# Patient Record
Sex: Female | Born: 1985 | Race: Black or African American | Hispanic: No | Marital: Married
Health system: Southern US, Community
[De-identification: ages and names within clinical notes are randomized; demographics above are authoritative.]

## PROBLEM LIST (undated history)

## (undated) DIAGNOSIS — M329 Systemic lupus erythematosus, unspecified: Secondary | ICD-10-CM

## (undated) DIAGNOSIS — IMO0002 Reserved for concepts with insufficient information to code with codable children: Secondary | ICD-10-CM

---

## 2019-02-06 ENCOUNTER — Emergency Department (HOSPITAL_COMMUNITY): Payer: No Typology Code available for payment source

## 2019-02-06 ENCOUNTER — Encounter (HOSPITAL_COMMUNITY)
Admission: EM | Disposition: A | Discharge status: Home / Self Care | Payer: Self-pay | Source: Home / Self Care | Attending: Emergency Medicine

## 2019-02-06 ENCOUNTER — Ambulatory Visit (HOSPITAL_COMMUNITY)
Admission: EM | Admit: 2019-02-06 | Discharge: 2019-02-06 | Disposition: A | Discharge status: Home / Self Care | Payer: No Typology Code available for payment source | Attending: Emergency Medicine | Admitting: Emergency Medicine

## 2019-02-06 ENCOUNTER — Emergency Department (HOSPITAL_COMMUNITY): Payer: No Typology Code available for payment source | Admitting: Certified Registered Nurse Anesthetist

## 2019-02-06 ENCOUNTER — Other Ambulatory Visit: Payer: Self-pay

## 2019-02-06 ENCOUNTER — Encounter (HOSPITAL_COMMUNITY): Payer: Self-pay | Admitting: Emergency Medicine

## 2019-02-06 DIAGNOSIS — Z888 Allergy status to other drugs, medicaments and biological substances status: Secondary | ICD-10-CM | POA: Diagnosis not present

## 2019-02-06 DIAGNOSIS — J398 Other specified diseases of upper respiratory tract: Secondary | ICD-10-CM | POA: Insufficient documentation

## 2019-02-06 DIAGNOSIS — Z6833 Body mass index (BMI) 33.0-33.9, adult: Secondary | ICD-10-CM | POA: Diagnosis not present

## 2019-02-06 DIAGNOSIS — S161XXA Strain of muscle, fascia and tendon at neck level, initial encounter: Secondary | ICD-10-CM | POA: Insufficient documentation

## 2019-02-06 DIAGNOSIS — Z79899 Other long term (current) drug therapy: Secondary | ICD-10-CM | POA: Insufficient documentation

## 2019-02-06 DIAGNOSIS — S301XXA Contusion of abdominal wall, initial encounter: Secondary | ICD-10-CM | POA: Insufficient documentation

## 2019-02-06 DIAGNOSIS — Z23 Encounter for immunization: Secondary | ICD-10-CM | POA: Diagnosis not present

## 2019-02-06 DIAGNOSIS — T07XXXA Unspecified multiple injuries, initial encounter: Secondary | ICD-10-CM

## 2019-02-06 DIAGNOSIS — Z1159 Encounter for screening for other viral diseases: Secondary | ICD-10-CM | POA: Diagnosis not present

## 2019-02-06 DIAGNOSIS — E669 Obesity, unspecified: Secondary | ICD-10-CM | POA: Insufficient documentation

## 2019-02-06 DIAGNOSIS — S27321A Contusion of lung, unilateral, initial encounter: Secondary | ICD-10-CM | POA: Diagnosis not present

## 2019-02-06 DIAGNOSIS — S52501B Unspecified fracture of the lower end of right radius, initial encounter for open fracture type I or II: Secondary | ICD-10-CM | POA: Diagnosis present

## 2019-02-06 DIAGNOSIS — R221 Localized swelling, mass and lump, neck: Secondary | ICD-10-CM

## 2019-02-06 DIAGNOSIS — Y92410 Unspecified street and highway as the place of occurrence of the external cause: Secondary | ICD-10-CM | POA: Insufficient documentation

## 2019-02-06 DIAGNOSIS — M329 Systemic lupus erythematosus, unspecified: Secondary | ICD-10-CM | POA: Insufficient documentation

## 2019-02-06 DIAGNOSIS — S20219A Contusion of unspecified front wall of thorax, initial encounter: Secondary | ICD-10-CM | POA: Diagnosis not present

## 2019-02-06 HISTORY — PX: ORIF WRIST FRACTURE: SHX2133

## 2019-02-06 HISTORY — DX: Systemic lupus erythematosus, unspecified: M32.9

## 2019-02-06 HISTORY — DX: Reserved for concepts with insufficient information to code with codable children: IMO0002

## 2019-02-06 LAB — CBC
HCT: 39.5 % (ref 36.0–46.0)
Hemoglobin: 12.4 g/dL (ref 12.0–15.0)
MCH: 26.5 pg (ref 26.0–34.0)
MCHC: 31.4 g/dL (ref 30.0–36.0)
MCV: 84.4 fL (ref 80.0–100.0)
Platelets: 228 10*3/uL (ref 150–400)
RBC: 4.68 MIL/uL (ref 3.87–5.11)
RDW: 14.2 % (ref 11.5–15.5)
WBC: 10.5 10*3/uL (ref 4.0–10.5)
nRBC: 0 % (ref 0.0–0.2)

## 2019-02-06 LAB — SAMPLE TO BLOOD BANK

## 2019-02-06 LAB — COMPREHENSIVE METABOLIC PANEL
ALT: 29 U/L (ref 0–44)
AST: 28 U/L (ref 15–41)
Albumin: 3.6 g/dL (ref 3.5–5.0)
Alkaline Phosphatase: 47 U/L (ref 38–126)
Anion gap: 11 (ref 5–15)
BUN: 12 mg/dL (ref 6–20)
CO2: 21 mmol/L — ABNORMAL LOW (ref 22–32)
Calcium: 8.8 mg/dL — ABNORMAL LOW (ref 8.9–10.3)
Chloride: 106 mmol/L (ref 98–111)
Creatinine, Ser: 0.87 mg/dL (ref 0.44–1.00)
GFR calc Af Amer: >60 mL/min (ref >60)
GFR calc non Af Amer: >60 mL/min (ref >60)
Glucose, Bld: 115 mg/dL — ABNORMAL HIGH (ref 70–99)
Potassium: 3.4 mmol/L — ABNORMAL LOW (ref 3.5–5.1)
Sodium: 138 mmol/L (ref 135–145)
Total Bilirubin: 1 mg/dL (ref 0.3–1.2)
Total Protein: 6.5 g/dL (ref 6.5–8.1)

## 2019-02-06 LAB — I-STAT CHEM 8, ED
BUN: 15 mg/dL (ref 6–20)
Calcium, Ion: 1.08 mmol/L — ABNORMAL LOW (ref 1.15–1.40)
Chloride: 108 mmol/L (ref 98–111)
Creatinine, Ser: 0.8 mg/dL (ref 0.44–1.00)
Glucose, Bld: 114 mg/dL — ABNORMAL HIGH (ref 70–99)
HCT: 40 % (ref 36.0–46.0)
Hemoglobin: 13.6 g/dL (ref 12.0–15.0)
Potassium: 3.6 mmol/L (ref 3.5–5.1)
Sodium: 137 mmol/L (ref 135–145)
TCO2: 22 mmol/L (ref 22–32)

## 2019-02-06 LAB — ETHANOL: Alcohol, Ethyl (B): <10 mg/dL (ref <10)

## 2019-02-06 LAB — SARS CORONAVIRUS 2 BY RT PCR (HOSPITAL ORDER, PERFORMED IN ~~LOC~~ HOSPITAL LAB): SARS Coronavirus 2: NEGATIVE

## 2019-02-06 LAB — CDS SEROLOGY

## 2019-02-06 LAB — I-STAT BETA HCG BLOOD, ED (MC, WL, AP ONLY): I-stat hCG, quantitative: <5 m[IU]/mL (ref <5)

## 2019-02-06 LAB — LACTIC ACID, PLASMA: Lactic Acid, Venous: 2.2 mmol/L (ref 0.5–1.9)

## 2019-02-06 LAB — PROTIME-INR
INR: 1 (ref 0.8–1.2)
Prothrombin Time: 13.3 seconds (ref 11.4–15.2)

## 2019-02-06 SURGERY — OPEN REDUCTION INTERNAL FIXATION (ORIF) WRIST FRACTURE
Anesthesia: Regional | Site: Wrist | Laterality: Right

## 2019-02-06 MED ORDER — CEFAZOLIN SODIUM-DEXTROSE 1-4 GM/50ML-% IV SOLN
1.0000 g | Freq: Once | INTRAVENOUS | Status: AC
  Administered 2019-02-06: 11:00:00 1 g via INTRAVENOUS
  Filled 2019-02-06: qty 50

## 2019-02-06 MED ORDER — OXYCODONE HCL 5 MG PO TABS: 5.0000 mg | ORAL_TABLET | Freq: Once | ORAL | Status: DC | PRN

## 2019-02-06 MED ORDER — ROPIVACAINE HCL 7.5 MG/ML IJ SOLN
INTRAMUSCULAR | Status: DC | PRN
  Administered 2019-02-06: 30 mL via PERINEURAL

## 2019-02-06 MED ORDER — PROMETHAZINE HCL 25 MG/ML IJ SOLN: 6.2500 mg | INTRAMUSCULAR | Status: DC | PRN

## 2019-02-06 MED ORDER — GABAPENTIN 300 MG PO CAPS
ORAL_CAPSULE | ORAL | Status: AC
  Filled 2019-02-06: qty 1

## 2019-02-06 MED ORDER — MEPERIDINE HCL 25 MG/ML IJ SOLN: 6.2500 mg | INTRAMUSCULAR | Status: DC | PRN

## 2019-02-06 MED ORDER — MIDAZOLAM HCL 2 MG/2ML IJ SOLN
INTRAMUSCULAR | Status: AC
  Filled 2019-02-06: qty 2

## 2019-02-06 MED ORDER — PHENYLEPHRINE 40 MCG/ML (10ML) SYRINGE FOR IV PUSH (FOR BLOOD PRESSURE SUPPORT)
PREFILLED_SYRINGE | INTRAVENOUS | Status: AC
  Filled 2019-02-06: qty 10

## 2019-02-06 MED ORDER — MIDAZOLAM HCL 2 MG/2ML IJ SOLN
INTRAMUSCULAR | Status: DC | PRN
  Administered 2019-02-06: 2 mg via INTRAVENOUS

## 2019-02-06 MED ORDER — LACTATED RINGERS IV SOLN: INTRAVENOUS | Status: DC

## 2019-02-06 MED ORDER — ONDANSETRON HCL 4 MG/2ML IJ SOLN
4.0000 mg | Freq: Once | INTRAMUSCULAR | Status: AC
  Administered 2019-02-06: 08:00:00 4 mg via INTRAVENOUS
  Filled 2019-02-06: qty 2

## 2019-02-06 MED ORDER — ONDANSETRON HCL 4 MG/2ML IJ SOLN
INTRAMUSCULAR | Status: DC | PRN
  Administered 2019-02-06: 4 mg via INTRAVENOUS

## 2019-02-06 MED ORDER — CEPHALEXIN 500 MG PO CAPS: 500.0000 mg | ORAL_CAPSULE | Freq: Four times a day (QID) | ORAL | 0 refills | Status: AC

## 2019-02-06 MED ORDER — CEFAZOLIN SODIUM-DEXTROSE 2-4 GM/100ML-% IV SOLN
INTRAVENOUS | Status: AC
  Filled 2019-02-06: qty 100

## 2019-02-06 MED ORDER — PROPOFOL 10 MG/ML IV BOLUS
INTRAVENOUS | Status: AC
  Filled 2019-02-06: qty 40

## 2019-02-06 MED ORDER — SODIUM CHLORIDE 0.9 % IV BOLUS
1000.0000 mL | Freq: Once | INTRAVENOUS | Status: AC
  Administered 2019-02-06: 1000 mL via INTRAVENOUS

## 2019-02-06 MED ORDER — PHENYLEPHRINE HCL (PRESSORS) 10 MG/ML IV SOLN
INTRAVENOUS | Status: DC | PRN
  Administered 2019-02-06: 40 ug via INTRAVENOUS

## 2019-02-06 MED ORDER — ACETAMINOPHEN 500 MG PO TABS
ORAL_TABLET | ORAL | Status: AC
  Administered 2019-02-06: 13:00:00 1000 mg via ORAL
  Filled 2019-02-06: qty 2

## 2019-02-06 MED ORDER — TETANUS-DIPHTH-ACELL PERTUSSIS 5-2.5-18.5 LF-MCG/0.5 IM SUSP
0.5000 mL | Freq: Once | INTRAMUSCULAR | Status: AC
  Administered 2019-02-06: 0.5 mL via INTRAMUSCULAR
  Filled 2019-02-06: qty 0.5

## 2019-02-06 MED ORDER — ACETAMINOPHEN 500 MG PO TABS
1000.0000 mg | ORAL_TABLET | Freq: Once | ORAL | Status: AC
  Administered 2019-02-06: 13:00:00 1000 mg via ORAL

## 2019-02-06 MED ORDER — PROPOFOL 10 MG/ML IV BOLUS
0.5000 mg/kg | Freq: Once | INTRAVENOUS | Status: AC
  Administered 2019-02-06: 41.1 mg via INTRAVENOUS
  Filled 2019-02-06: qty 20

## 2019-02-06 MED ORDER — 0.9 % SODIUM CHLORIDE (POUR BTL) OPTIME
TOPICAL | Status: DC | PRN
  Administered 2019-02-06: 1000 mL

## 2019-02-06 MED ORDER — ONDANSETRON HCL 4 MG/2ML IJ SOLN
INTRAMUSCULAR | Status: AC
  Filled 2019-02-06: qty 2

## 2019-02-06 MED ORDER — DEXAMETHASONE SODIUM PHOSPHATE 10 MG/ML IJ SOLN
INTRAMUSCULAR | Status: DC | PRN
  Administered 2019-02-06: 5 mg via INTRAVENOUS

## 2019-02-06 MED ORDER — ONDANSETRON HCL 4 MG PO TABS: 4.0000 mg | ORAL_TABLET | Freq: Three times a day (TID) | ORAL | 0 refills | Status: AC | PRN

## 2019-02-06 MED ORDER — DEXAMETHASONE SODIUM PHOSPHATE 10 MG/ML IJ SOLN
INTRAMUSCULAR | Status: AC
  Filled 2019-02-06: qty 1

## 2019-02-06 MED ORDER — MORPHINE SULFATE (PF) 4 MG/ML IV SOLN
4.0000 mg | Freq: Once | INTRAVENOUS | Status: AC
  Administered 2019-02-06: 4 mg via INTRAVENOUS
  Filled 2019-02-06: qty 1

## 2019-02-06 MED ORDER — SODIUM CHLORIDE 0.9 % IV SOLN
INTRAVENOUS | Status: DC
  Administered 2019-02-06: 08:00:00 via INTRAVENOUS

## 2019-02-06 MED ORDER — PROPOFOL 500 MG/50ML IV EMUL
INTRAVENOUS | Status: DC | PRN
  Administered 2019-02-06: 50 ug/kg/min via INTRAVENOUS

## 2019-02-06 MED ORDER — PROPOFOL 10 MG/ML IV BOLUS
INTRAVENOUS | Status: AC | PRN
  Administered 2019-02-06 (×2): 20 mg via INTRAVENOUS

## 2019-02-06 MED ORDER — PROPOFOL 10 MG/ML IV BOLUS
INTRAVENOUS | Status: AC | PRN
  Administered 2019-02-06: 20 mg via INTRAVENOUS
  Administered 2019-02-06: 40 mg via INTRAVENOUS

## 2019-02-06 MED ORDER — GABAPENTIN 300 MG PO CAPS: 300.0000 mg | ORAL_CAPSULE | Freq: Once | ORAL | Status: DC

## 2019-02-06 MED ORDER — FENTANYL CITRATE (PF) 250 MCG/5ML IJ SOLN
INTRAMUSCULAR | Status: AC
  Filled 2019-02-06: qty 5

## 2019-02-06 MED ORDER — CEFAZOLIN SODIUM-DEXTROSE 2-4 GM/100ML-% IV SOLN
2.0000 g | INTRAVENOUS | Status: AC
  Administered 2019-02-06: 14:00:00 2 g via INTRAVENOUS

## 2019-02-06 MED ORDER — FENTANYL CITRATE (PF) 250 MCG/5ML IJ SOLN
INTRAMUSCULAR | Status: DC | PRN
  Administered 2019-02-06: 50 ug via INTRAVENOUS

## 2019-02-06 MED ORDER — HYDROMORPHONE HCL 1 MG/ML IJ SOLN: 0.2500 mg | INTRAMUSCULAR | Status: DC | PRN

## 2019-02-06 MED ORDER — MORPHINE SULFATE (PF) 4 MG/ML IV SOLN
4.0000 mg | Freq: Once | INTRAVENOUS | Status: AC
  Administered 2019-02-06: 08:00:00 4 mg via INTRAVENOUS
  Filled 2019-02-06: qty 1

## 2019-02-06 MED ORDER — OXYCODONE HCL 5 MG PO TABS: 5.0000 mg | ORAL_TABLET | ORAL | 0 refills | Status: DC | PRN

## 2019-02-06 MED ORDER — ENSURE PRE-SURGERY PO LIQD: 296.0000 mL | Freq: Once | ORAL | Status: DC

## 2019-02-06 MED ORDER — POVIDONE-IODINE 7.5 % EX SOLN: Freq: Once | CUTANEOUS | Status: DC

## 2019-02-06 MED ORDER — CLONIDINE HCL (ANALGESIA) 100 MCG/ML EP SOLN
EPIDURAL | Status: DC | PRN
  Administered 2019-02-06: 100 ug

## 2019-02-06 MED ORDER — OXYCODONE HCL 5 MG PO TABS: 5.0000 mg | ORAL_TABLET | ORAL | 0 refills | Status: AC | PRN

## 2019-02-06 MED ORDER — OXYCODONE HCL 5 MG/5ML PO SOLN: 5.0000 mg | Freq: Once | ORAL | Status: DC | PRN

## 2019-02-06 MED ORDER — LIDOCAINE-EPINEPHRINE (PF) 2 %-1:200000 IJ SOLN
10.0000 mL | Freq: Once | INTRAMUSCULAR | Status: DC
  Filled 2019-02-06: qty 20

## 2019-02-06 MED ORDER — IOHEXOL 300 MG/ML  SOLN
100.0000 mL | Freq: Once | INTRAMUSCULAR | Status: AC | PRN
  Administered 2019-02-06: 100 mL via INTRAVENOUS

## 2019-02-06 MED ORDER — LACTATED RINGERS IV SOLN
INTRAVENOUS | Status: DC
  Administered 2019-02-06 (×2): via INTRAVENOUS

## 2019-02-06 SURGICAL SUPPLY — 68 items
BANDAGE ACE 3X5.8 VEL STRL LF (GAUZE/BANDAGES/DRESSINGS) ×3 IMPLANT
BANDAGE ACE 4X5 VEL STRL LF (GAUZE/BANDAGES/DRESSINGS) ×3 IMPLANT
BENZOIN TINCTURE PRP APPL 2/3 (GAUZE/BANDAGES/DRESSINGS) ×3 IMPLANT
BIT DRILL 2.2 SS TIBIAL (BIT) ×3 IMPLANT
BLADE 15 SAFETY STRL DISP (BLADE) ×3 IMPLANT
BLADE CLIPPER SURG (BLADE) IMPLANT
BNDG COHESIVE 4X5 TAN STRL (GAUZE/BANDAGES/DRESSINGS) ×3 IMPLANT
BNDG ESMARK 4X9 LF (GAUZE/BANDAGES/DRESSINGS) ×3 IMPLANT
BNDG GAUZE ELAST 4 BULKY (GAUZE/BANDAGES/DRESSINGS) ×3 IMPLANT
CLOSURE WOUND 1/2 X4 (GAUZE/BANDAGES/DRESSINGS) ×1
CORD BIPOLAR FORCEPS 12FT (ELECTRODE) ×3 IMPLANT
COVER SURGICAL LIGHT HANDLE (MISCELLANEOUS) ×3 IMPLANT
COVER WAND RF STERILE (DRAPES) ×3 IMPLANT
CUFF TOURN SGL QUICK 18X4 (TOURNIQUET CUFF) ×3 IMPLANT
CUFF TOURN SGL QUICK 24 (TOURNIQUET CUFF)
CUFF TRNQT CYL 24X4X16.5-23 (TOURNIQUET CUFF) IMPLANT
DECANTER SPIKE VIAL GLASS SM (MISCELLANEOUS) IMPLANT
DRAPE OEC MINIVIEW 54X84 (DRAPES) ×3 IMPLANT
DRAPE U-SHAPE 47X51 STRL (DRAPES) IMPLANT
ELECT REM PT RETURN 9FT ADLT (ELECTROSURGICAL) ×3
ELECTRODE REM PT RTRN 9FT ADLT (ELECTROSURGICAL) ×1 IMPLANT
GAUZE SPONGE 4X4 12PLY STRL (GAUZE/BANDAGES/DRESSINGS) IMPLANT
GAUZE SPONGE 4X4 12PLY STRL LF (GAUZE/BANDAGES/DRESSINGS) ×3 IMPLANT
GAUZE XEROFORM 1X8 LF (GAUZE/BANDAGES/DRESSINGS) IMPLANT
GAUZE XEROFORM 5X9 LF (GAUZE/BANDAGES/DRESSINGS) ×3 IMPLANT
GLOVE BIO SURGEON STRL SZ7.5 (GLOVE) ×6 IMPLANT
GLOVE BIOGEL PI IND STRL 8 (GLOVE) ×2 IMPLANT
GLOVE BIOGEL PI INDICATOR 8 (GLOVE) ×4
GOWN STRL REUS W/ TWL LRG LVL3 (GOWN DISPOSABLE) ×2 IMPLANT
GOWN STRL REUS W/ TWL XL LVL3 (GOWN DISPOSABLE) ×2 IMPLANT
GOWN STRL REUS W/TWL LRG LVL3 (GOWN DISPOSABLE) ×4
GOWN STRL REUS W/TWL XL LVL3 (GOWN DISPOSABLE) ×4
K-WIRE 1.6 (WIRE) ×4
K-WIRE FX5X1.6XNS BN SS (WIRE) ×2
KIT BASIN OR (CUSTOM PROCEDURE TRAY) ×3 IMPLANT
KIT TURNOVER KIT B (KITS) ×3 IMPLANT
KWIRE FX5X1.6XNS BN SS (WIRE) ×2 IMPLANT
MANIFOLD NEPTUNE II (INSTRUMENTS) ×3 IMPLANT
NEEDLE HYPO 25GX1X1/2 BEV (NEEDLE) IMPLANT
NS IRRIG 1000ML POUR BTL (IV SOLUTION) ×3 IMPLANT
PACK ORTHO EXTREMITY (CUSTOM PROCEDURE TRAY) ×3 IMPLANT
PAD ARMBOARD 7.5X6 YLW CONV (MISCELLANEOUS) ×6 IMPLANT
PAD CAST 3X4 CTTN HI CHSV (CAST SUPPLIES) ×1 IMPLANT
PAD CAST 4YDX4 CTTN HI CHSV (CAST SUPPLIES) ×1 IMPLANT
PADDING CAST COTTON 3X4 STRL (CAST SUPPLIES) ×2
PADDING CAST COTTON 4X4 STRL (CAST SUPPLIES) ×2
PEG LOCKING SMOOTH 2.2X16 (Screw) ×3 IMPLANT
PEG LOCKING SMOOTH 2.2X18 (Peg) ×18 IMPLANT
PLATE DVR CROSSLOCK STD RT (Plate) ×3 IMPLANT
SCREW LOCK 14X2.7X 3 LD TPR (Screw) ×1 IMPLANT
SCREW LOCKING 2.7X13MM (Screw) ×3 IMPLANT
SCREW LOCKING 2.7X14 (Screw) ×2 IMPLANT
SCREW LOCKING 2.7X15MM (Screw) ×3 IMPLANT
SPLINT PLASTER CAST XFAST 4X15 (CAST SUPPLIES) ×1 IMPLANT
SPLINT PLASTER XTRA FAST SET 4 (CAST SUPPLIES) ×2
SPONGE LAP 4X18 RFD (DISPOSABLE) ×3 IMPLANT
STRIP CLOSURE SKIN 1/2X4 (GAUZE/BANDAGES/DRESSINGS) ×2 IMPLANT
SUT ETHILON 3 0 PS 1 (SUTURE) ×6 IMPLANT
SUT MNCRL AB 4-0 PS2 18 (SUTURE) IMPLANT
SUT VIC AB 2-0 CT2 27 (SUTURE) ×3 IMPLANT
SYR CONTROL 10ML LL (SYRINGE) IMPLANT
TOWEL GREEN STERILE FF (TOWEL DISPOSABLE) ×3 IMPLANT
TOWEL OR 17X26 10 PK STRL BLUE (TOWEL DISPOSABLE) ×3 IMPLANT
TOWEL OR NON WOVEN STRL DISP B (DISPOSABLE) IMPLANT
TUBE CONNECTING 12'X1/4 (SUCTIONS) ×1
TUBE CONNECTING 12X1/4 (SUCTIONS) ×2 IMPLANT
WATER STERILE IRR 1000ML POUR (IV SOLUTION) ×3 IMPLANT
YANKAUER SUCT BULB TIP NO VENT (SUCTIONS) ×3 IMPLANT
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
## 2019-02-06 NOTE — Anesthesia Procedure Notes (Signed)
Procedure Name: MAC Date/Time: 02/06/2019 1:38 PM Performed by: Kathryne Hitch, CRNA Pre-anesthesia Checklist: Patient identified, Emergency Drugs available, Suction available, Patient being monitored and Timeout performed Patient Re-evaluated:Patient Re-evaluated prior to induction Oxygen Delivery Method: Simple face mask Preoxygenation: Pre-oxygenation with 100% oxygen Induction Type: IV induction Dental Injury: Teeth and Oropharynx as per pre-operative assessment

## 2019-02-06 NOTE — Op Note (Signed)
02/06/2019  1:36 PM  PATIENT:  Lori Holloway    PRE-OPERATIVE DIAGNOSIS:  open right distal radius fracture  POST-OPERATIVE DIAGNOSIS:  Same  PROCEDURE:  OPEN REDUCTION INTERNAL FIXATION (ORIF) DISTAL RADIUS FRACTURE  SURGEON:  Renette Butters, MD  ASSISTANT: Roxan Hockey, PA-C, he was present and scrubbed throughout the case, critical for completion in a timely fashion, and for retraction, instrumentation, and closure.   ANESTHESIA:   gen  PREOPERATIVE INDICATIONS:  [name] is a  33 y.o. female with a diagnosis of open right distal radius fracture who failed conservative measures and elected for surgical management.    The risks benefits and alternatives were discussed with the patient preoperatively including but not limited to the risks of infection, bleeding, nerve injury, cardiopulmonary complications, the need for revision surgery, among others, and the patient was willing to proceed.  OPERATIVE IMPLANTS: DVR plate  OPERATIVE FINDINGS: open fracture  BLOOD LOSS: min  COMPLICATIONS: none  TOURNIQUET TIME: 24min  OPERATIVE PROCEDURE:  Patient was identified in the preoperative holding area and site was marked by me She was transported to the operating theater and placed on the table in supine position taking care to pad all bony prominences. After a preincinduction time out anesthesia was induced. The right upper extremity was prepped and draped in normal sterile fashion and a pre-incision timeout was performed. She received ancef for preoperative antibiotics.   I delivered the end of her DR fracture that had lacerated dermis and performed a thurough irrigation and debridement with saline and pick up.   I made a 5 cm incision centered over her FCR tendon and dissected down carefully to the level of the flexor tendon sheath and incise this longitudinally and retracted the FCR radially and incised the dorsal aspect of the sheath.   I bluntly dissected the FPL  muscle belly away from the brachioradialis and then sharply incised the pronator tendon from the distal radius and from the wrist capsule. I Elevated this off the bone the fractures visible.   I released the brachioradialis from its insertion. I then debrided the fracture and performed a manual reduction.   I selected a plate and I placed it on the bone. I pinned it into place and was happy on multiple radiographic views with it's placement. I then fixed the plate distally with the locking pegs. I confirmed no articular penetration with the pegs and that none were prominent dorsally.   I then reduced the plate to the shaft improving the volar and radial tilt of her distal radius.  I performed a complex closure.   I was happy with the final fluoro xrays 3 views   I thoroughly irrigated the wound and closed the pronator over top of the plate and then closed the skin in layers with absorbable stitch. Sterile dressing was applied using the PACU in stable condition.  POST OPERATIVE PLAN: NWB, Splint full time. Ambulate for DVT px.

## 2019-02-06 NOTE — Transfer of Care (Signed)
Immediate Anesthesia Transfer of Care Note  Patient: Lori Holloway  Procedure(s) Performed: OPEN REDUCTION INTERNAL FIXATION (ORIF) DISTAL RADIUS FRACTURE (Right Wrist)  Patient Location: PACU  Anesthesia Type:MAC and Regional  Level of Consciousness: awake, alert , oriented and patient cooperative  Airway & Oxygen Therapy: Patient Spontanous Breathing  Post-op Assessment: Report given to RN and Post -op Vital signs reviewed and stable  Post vital signs: Reviewed and stable  Last Vitals:  Vitals Value Taken Time  BP 132/65 02/06/19 1507  Temp    Pulse    Resp 16 02/06/19 1507  SpO2    Vitals shown include unvalidated device data.  Last Pain:  Vitals:   02/06/19 0933  TempSrc:   PainSc: 4          Complications: No apparent anesthesia complications

## 2019-02-06 NOTE — Progress Notes (Signed)
Orthopedic Tech Progress Note Patient Details:  Lori Holloway [birth year] [id number]  Ortho Devices Type of Ortho Device: Ace wrap, Arm sling, Sugartong splint Ortho Device/Splint Location: Right Ortho Device/Splint Interventions: Application   Post Interventions Patient Tolerated: Well Instructions Provided: Care of device   [name] 02/06/2019, 10:46 AM

## 2019-02-06 NOTE — Discharge Instructions (Addendum)
Your thyroid will need to be evaluated by your doctor when you get home.   Right Wrist Fracture: Keep wrist elevated above your heart with ice as much as possible to reduce pain and swelling.  Take Antibiotic Medication for the full 7 days to prevent infection.  Pain: If needed, you may increase breakthrough pain medication (oxycodone) for the first few days post op to 2 tablets every 4 hours.  Stop as needed pain medication as soon as you are able.  Resume your chronic medications.  Diet: As you were doing prior to hospitalization   Shower:  You have a splint on, leave the splint in place and keep the splint dry with a plastic bag.  Dressing:  You have a splint - leave the splint in place and we will change your bandages during your first follow-up appointment.    Activity:  Increase activity slowly as tolerated, but follow the weight bearing instructions below.  The rules on driving is that you can not be taking narcotics while you drive, and you must feel in control of the vehicle.    Weight Bearing:  Non weight bearing affected wrist.  Sling for comfort.   To prevent constipation: you may use a stool softener such as -  Colace (over the counter) 100 mg by mouth twice a day  Drink plenty of fluids (prune juice may be helpful) and high fiber foods Miralax (over the counter) for constipation as needed.    Itching:  If you experience itching with your medications, try taking only a single pain pill, or even half a pain pill at a time.  You can also use benadryl over the counter for itching or also to help with sleep.   Precautions:  If you experience chest pain or shortness of breath - call 911 immediately for transfer to the hospital emergency department!!  If you develop a fever greater that 101 F, purulent drainage from wound, increased redness or drainage from wound, or calf pain -- Call the office at [phone]                                         Follow- Up Appointment:   Please call for an appointment to be seen in 1-2 weeks [place] - [phone]

## 2019-02-06 NOTE — ED Provider Notes (Signed)
..  Laceration Repair  Date/Time: 02/06/2019 11:10 AM Performed by: Margarita Mail, PA-C Authorized by: Margarita Mail, PA-C   Consent:    Consent obtained:  Verbal   Consent given by:  Patient   Risks discussed:  Infection, need for additional repair, pain, poor cosmetic result and poor wound healing   Alternatives discussed:  No treatment and delayed treatment Universal protocol:    Procedure explained and questions answered to patient or proxy's satisfaction: yes     Relevant documents present and verified: yes     Test results available and properly labeled: yes     Imaging studies available: yes     Required blood products, implants, devices, and special equipment available: yes     Site/side marked: yes     Immediately prior to procedure, a time out was called: yes     Patient identity confirmed:  Verbally with patient Anesthesia (see MAR for exact dosages):    Anesthesia method:  Local infiltration Laceration details:    Location:  Shoulder/arm   Shoulder/arm location:  R lower arm   Length (cm):  5   Depth (mm):  15 Repair type:    Repair type:  Simple Exploration:    Hemostasis achieved with:  Epinephrine   Wound exploration: entire depth of wound probed and visualized     Wound extent: muscle damage     Wound extent: no foreign bodies/material noted   Treatment:    Area cleansed with:  Betadine and saline   Amount of cleaning:  Standard   Irrigation solution:  Sterile saline Skin repair:    Repair method:  Sutures   Suture size:  3-0   Suture material:  Prolene   Suture technique:  Running locked   Number of sutures:  8 Approximation:    Approximation:  Close Post-procedure details:    Dressing:  Sterile dressing   Patient tolerance of procedure:  Tolerated well, no immediate complications      Margarita Mail, PA-C 02/06/19 1112    Isla Pence, MD 02/06/19 1419
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
## 2019-02-06 NOTE — ED Notes (Signed)
Pt returned from radiology.

## 2019-02-06 NOTE — Anesthesia Preprocedure Evaluation (Signed)
Anesthesia Evaluation  Patient identified by MRN, date of birth, ID band Patient awake    Reviewed: Allergy & Precautions, NPO status , Patient's Chart, lab work & pertinent test results  Airway Mallampati: II  TM Distance: >3 FB Neck ROM: Full    Dental no notable dental hx. (+) Dental Advisory Given   Pulmonary neg pulmonary ROS,    Pulmonary exam normal breath sounds clear to auscultation       Cardiovascular negative cardio ROS Normal cardiovascular exam Rhythm:Regular Rate:Normal     Neuro/Psych negative neurological ROS  negative psych ROS   GI/Hepatic negative GI ROS, Neg liver ROS,   Endo/Other  negative endocrine ROS  Renal/GU negative Renal ROS     Musculoskeletal negative musculoskeletal ROS (+)   Abdominal (+) + obese,   Peds  Hematology negative hematology ROS (+)   Anesthesia Other Findings   Reproductive/Obstetrics negative OB ROS                            Anesthesia Physical Anesthesia Plan  ASA: II  Anesthesia Plan: Regional   Post-op Pain Management:    Induction: Intravenous  PONV Risk Score and Plan: Propofol infusion, TIVA, Midazolam and Treatment may vary due to age or medical condition  Airway Management Planned: Natural Airway  Additional Equipment:   Intra-op Plan:   Post-operative Plan:   Informed Consent: I have reviewed the patients History and Physical, chart, labs and discussed the procedure including the risks, benefits and alternatives for the proposed anesthesia with the patient or authorized representative who has indicated his/her understanding and acceptance.     Dental advisory given  Plan Discussed with: CRNA  Anesthesia Plan Comments:         Anesthesia Quick Evaluation

## 2019-02-06 NOTE — Anesthesia Procedure Notes (Signed)
Anesthesia Regional Block: Axillary brachial plexus block   Pre-Anesthetic Checklist: ,, timeout performed, Correct Patient, Correct Site, Correct Laterality, Correct Procedure, Correct Position, site marked, Risks and benefits discussed,  Surgical consent,  Pre-op evaluation,  At surgeon's request and post-op pain management  Laterality: Right  Prep: chloraprep       Needles:  Injection technique: Single-shot  Needle Type: Stimiplex          Additional Needles:   Procedures:,,,, ultrasound used (permanent image in chart),,,,  Narrative:  Start time: 02/06/2019 1:19 PM End time: 02/06/2019 1:24 PM  Performed by: Personally  Anesthesiologist: Nolon Nations, MD  Additional Notes: Patient tolerated the procedure well without complications

## 2019-02-06 NOTE — ED Notes (Signed)
Patient transported to X-ray. Transport sts she will take her to CT after.

## 2019-02-06 NOTE — Anesthesia Postprocedure Evaluation (Signed)
Anesthesia Post Note  Patient: Lori Holloway  Procedure(s) Performed: OPEN REDUCTION INTERNAL FIXATION (ORIF) DISTAL RADIUS FRACTURE (Right Wrist)     Patient location during evaluation: PACU Anesthesia Type: Regional Level of consciousness: awake and alert Pain management: pain level controlled Vital Signs Assessment: post-procedure vital signs reviewed and stable Respiratory status: spontaneous breathing Cardiovascular status: stable Anesthetic complications: no    Last Vitals:  Vitals:   02/06/19 1325 02/06/19 1510  BP: 121/65 132/65  Pulse: 76 66  Resp:  (!) 23  Temp:  (!) 36.1 C  SpO2: 99% 100%    Last Pain:  Vitals:   02/06/19 1510  TempSrc:   PainSc: 0-No pain                 Nolon Nations
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
## 2019-02-09 ENCOUNTER — Telehealth: Payer: Self-pay | Admitting: *Deleted

## 2019-02-09 NOTE — Telephone Encounter (Signed)
Pt called to see if records were sent to her PCP in [place], [place].  EDCM routed records to PCP as requested by pt.  Charm Rings, DO [facility], [place] [phone]

## 2019-02-15 ENCOUNTER — Encounter (HOSPITAL_COMMUNITY): Payer: Self-pay | Admitting: Orthopedic Surgery
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
